# Patient Record
Sex: Male | Born: 1960 | Race: White | Hispanic: No | Marital: Married | Smoking: Former smoker
Health system: Southern US, Community
[De-identification: ages and names within clinical notes are randomized; demographics above are authoritative.]

## PROBLEM LIST (undated history)

## (undated) HISTORY — PX: PROSTATE BIOPSY: SHX241

## (undated) HISTORY — PX: WISDOM TOOTH EXTRACTION: SHX21

---

## 2009-04-21 ENCOUNTER — Ambulatory Visit: Payer: Self-pay | Admitting: Family Medicine

## 2010-12-13 ENCOUNTER — Ambulatory Visit (INDEPENDENT_AMBULATORY_CARE_PROVIDER_SITE_OTHER): Payer: 59 | Admitting: Internal Medicine

## 2010-12-13 ENCOUNTER — Encounter: Payer: Self-pay | Admitting: Internal Medicine

## 2010-12-13 VITALS — BP 120/80 | HR 81 | Temp 98.1°F | Ht 75.0 in | Wt 250.8 lb

## 2010-12-13 DIAGNOSIS — R059 Cough, unspecified: Secondary | ICD-10-CM

## 2010-12-13 DIAGNOSIS — R05 Cough: Secondary | ICD-10-CM

## 2010-12-13 MED ORDER — PREDNISONE (PAK) 10 MG PO TABS: ORAL_TABLET | ORAL | Status: AC

## 2010-12-13 NOTE — Patient Instructions (Addendum)
As long as you have any tendency at all to cough I recommend you Prilosec 20 mg  30 mg before bfast daily and Pepcid 20 mg one at night  GERD (REFLUX)  is an extremely common cause of respiratory symptoms, many times with no significant heartburn at all.    It can be treated with medication, but also with lifestyle changes including avoidance of late meals, excessive alcohol, smoking cessation, and avoid fatty foods, chocolate, peppermint, colas, red wine, and acidic juices such as orange juice.  NO MINT OR MENTHOL PRODUCTS SO NO COUGH DROPS  USE SUGARLESS CANDY INSTEAD (jolley ranchers or Stover's)  NO OIL BASED VITAMINS   Prednisone 10 mg take  4 each am x 2 days,   2 each am x 2 days,  1 each am x2days and stop   Please see patient coordinator before you leave today  to schedule sinus CT  We will call you with your cxr and labs  Please schedule a follow up office visit in 4 weeks, call sooner if needed

## 2010-12-13 NOTE — Assessment & Plan Note (Signed)
The most common causes of chronic cough in immunocompetent adults include the following: upper airway cough syndrome (UACS), previously referred to as postnasal drip syndrome (PNDS), which is caused by variety of rhinosinus conditions; (2) asthma; (3) GERD; (4) chronic bronchitis from cigarette smoking or other inhaled environmental irritants; (5) nonasthmatic eosinophilic bronchitis; and (6) bronchiectasis.   These conditions, singly or in combination, have accounted for up to 94% of the causes of chronic cough in prospective studies.   Other conditions have constituted no >6% of the causes in prospective studies These have included bronchogenic carcinoma, chronic interstitial pneumonia, sarcoidosis, left ventricular failure, ACEI-induced cough, and aspiration from a condition associated with pharyngeal dysfunction.   This is most c/w  Classic Upper airway cough syndrome, so named because it's frequently impossible to sort out how much is  CR/sinusitis with freq throat clearing (which can be related to primary GERD)   vs  causing  secondary (" extra esophageal")  GERD from wide swings in gastric pressure that occur with throat clearing, often  promoting self use of mint and menthol lozenges that reduce the lower esophageal sphincter tone and exacerbate the problem further in a cyclical fashion.   These are the same pts who not infrequently have failed to tolerate ace inhibitors,  dry powder inhalers or biphosphonates or report having reflux symptoms that don't respond to standard doses of PPI , and are easily confused as having aecopd or asthma flares,   See instructions for specific recommendations which were reviewed directly with the patient who was given a copy with highlighter outlining the key components.  

## 2010-12-13 NOTE — Progress Notes (Signed)
  Subjective:    Patient ID: Eugene Cummings, male    DOB: Feb 26, 1961, 50 y.o.   MRN: [id number]  HPI 57 yowm never regular with minimal allergy issues and new cough starting around 2010 self-referred for eval 12/13/2010   12/13/2010 Initial pulmonary office eval for persistent waxes and wanes for several years some better with abx,  Even better with prednisone bit even then not completely.  Some worse in am but doesn't disturb sleep,  Some discolored mucus daily.  Some doe with exertion like steps but no noct sob.    Pt denies any significant sore throat, dysphagia, itching, sneezing,  nasal congestion or excess/ purulent secretions,  fever, chills, sweats, unintended wt loss, pleuritic or exertional cp, hempoptysis, orthopnea pnd or leg swelling.    Also denies any obvious fluctuation of symptoms with weather or environmental changes or other aggravating or alleviating factors.     Review of Systems  Constitutional: Negative for fever, chills, activity change, appetite change and unexpected weight change.  HENT: Positive for dental problem. Negative for congestion, sore throat, rhinorrhea, sneezing, trouble swallowing, voice change and postnasal drip.   Eyes: Negative for visual disturbance.  Respiratory: Positive for cough and shortness of breath. Negative for choking.   Cardiovascular: Negative for chest pain and leg swelling.  Gastrointestinal: Negative for nausea, vomiting and abdominal pain.  Genitourinary: Negative for difficulty urinating.  Musculoskeletal: Negative for arthralgias.  Skin: Negative for rash.  Psychiatric/Behavioral: Negative for behavioral problems and confusion.       Objective:   Physical Exam Pleasant amb wm nad but freq throat clearing noted. Wt 250 12/13/2010  HEENT: nl dentition,  and orophanx. Nl external ear canals without cough reflex. Mod nonspecific turbinate edema   NECK :  without JVD/Nodes/TM/ nl carotid upstrokes bilaterally   LUNGS: no acc  muscle use, clear to A and P bilaterally without cough on insp or exp maneuvers   CV:  RRR  no s3 or murmur or increase in P2, no edema   ABD:  soft and nontender with nl excursion in the supine position. No bruits or organomegaly, bowel sounds nl  MS:  warm without deformities, calf tenderness, cyanosis or clubbing  SKIN: warm and dry without lesions    NEURO:  alert, approp, no deficits         Assessment & Plan:

## 2010-12-15 ENCOUNTER — Ambulatory Visit (INDEPENDENT_AMBULATORY_CARE_PROVIDER_SITE_OTHER)
Admission: RE | Admit: 2010-12-15 | Discharge: 2010-12-15 | Disposition: A | Discharge status: Home / Self Care | Payer: 59 | Source: Ambulatory Visit | Attending: Internal Medicine | Admitting: Internal Medicine

## 2010-12-15 DIAGNOSIS — R05 Cough: Secondary | ICD-10-CM

## 2010-12-15 DIAGNOSIS — R059 Cough, unspecified: Secondary | ICD-10-CM

## 2010-12-16 ENCOUNTER — Telehealth: Payer: Self-pay | Admitting: Internal Medicine

## 2010-12-16 NOTE — Telephone Encounter (Signed)
Pt aware of results 

## 2010-12-16 NOTE — Progress Notes (Signed)
Quick Note:   Pt aware of results and will discuss at next OV. ______

## 2017-11-09 ENCOUNTER — Encounter: Payer: Self-pay | Admitting: *Deleted

## 2017-11-20 ENCOUNTER — Encounter: Payer: Self-pay | Admitting: Family Medicine

## 2017-11-20 ENCOUNTER — Ambulatory Visit (INDEPENDENT_AMBULATORY_CARE_PROVIDER_SITE_OTHER): Payer: BC Managed Care – PPO | Admitting: Family Medicine

## 2017-11-20 VITALS — BP 138/80 | HR 90 | Temp 98.2°F | Resp 12 | Ht 75.0 in | Wt 247.0 lb

## 2017-11-20 DIAGNOSIS — Z7689 Persons encountering health services in other specified circumstances: Secondary | ICD-10-CM

## 2017-11-20 DIAGNOSIS — Z13220 Encounter for screening for lipoid disorders: Secondary | ICD-10-CM

## 2017-11-20 DIAGNOSIS — Z0000 Encounter for general adult medical examination without abnormal findings: Secondary | ICD-10-CM | POA: Diagnosis not present

## 2017-11-20 DIAGNOSIS — Z1211 Encounter for screening for malignant neoplasm of colon: Secondary | ICD-10-CM | POA: Diagnosis not present

## 2017-11-20 NOTE — Progress Notes (Signed)
Subjective:    Patient ID: Eugene Cummings, male    DOB: 1961-04-13, 57 y.o.   MRN: [id number]  HPI Patient is a very pleasant 57 year old Caucasian male here today to establish care.  Past medical history is relatively benign.  In the past he did have an elevated PSA.  Because of this he did undergo a prostate biopsy that was negative.  Outside of that, he has had a chronic cough for several years.  He states that he has had a thorough diagnostic workup including chest x-rays.  However he is never seen improvement.  The cough goes and comes.  He denies any fevers chills or shortness of breath or hemoptysis.  He denies any night sweats or fevers.  He cannot recall what has been tried to treat his chronic cough.  He is overdue for a colonoscopy.  He is due for prostate cancer screening however he has an appointment to see his urologist already scheduled for this year.  He is due for hepatitis C and HIV screening.  He is due for a tetanus shot. No past medical history on file. Past Surgical History:  Procedure Laterality Date  . PROSTATE BIOPSY     Current Outpatient Medications on File Prior to Visit  Medication Sig Dispense Refill  . tadalafil (CIALIS) 20 MG tablet Take 20 mg by mouth daily as needed for erectile dysfunction.     No current facility-administered medications on file prior to visit.    No Known Allergies Social History   Socioeconomic History  . Marital status: Married    Spouse name: Not on file  . Number of children: 2  . Years of education: Not on file  . Highest education level: Not on file  Occupational History  . 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 Financial resource strain: Not on file  . Food insecurity:    Worry: Not on file    Inability: Not on file  . Transportation needs:    Medical: Not on file    Non-medical: Not on file  Tobacco Use  . Smoking status: Former Smoker    Packs/day: 0.50   Years: 2.00    Pack years: 1.00    Types: Cigarettes    Last attempt to quit: 08/29/1978    Years since quitting: 39.2  . Smokeless tobacco: Never Used  Substance and Sexual Activity  . Alcohol use: Yes    Comment: twice per week  . Drug use: No    Comment: smoked occ maryjuana and states he stopped this in Nov 2011  . Sexual activity: Not on file  Lifestyle  . Physical activity:    Days per week: Not on file    Minutes per session: Not on file  . Stress: Not on file  Relationships  . Social connections:    Talks on phone: Not on file    Gets together: Not on file    Attends religious service: Not on file    Active member of club or organization: Not on file    Attends meetings of clubs or organizations: Not on file    Relationship status: Not on file  . Intimate partner violence:    Fear of current or ex partner: Not on file    Emotionally abused: Not on file    Physically abused: Not on file    Forced sexual activity: Not on file  Other Topics Concern  .  Not on file  Social History Narrative  . Not on file   Family History  Problem Relation Age of Onset  . COPD Maternal Grandfather   . Cancer Mother        breast, double mastectomy  . Deep vein thrombosis Father       Review of Systems  All other systems reviewed and are negative.      Objective:   Physical Exam  Constitutional: He is oriented to person, place, and time. He appears well-developed and well-nourished. No distress.  HENT:  Head: Normocephalic and atraumatic.  Right Ear: External ear normal.  Left Ear: External ear normal.  Nose: Nose normal.  Mouth/Throat: Oropharynx is clear and moist. No oropharyngeal exudate.  Eyes: Pupils are equal, round, and reactive to light. Conjunctivae and EOM are normal. Right eye exhibits no discharge. Left eye exhibits no discharge. No scleral icterus.  Neck: Normal range of motion. Neck supple. No JVD present. No tracheal deviation present. No thyromegaly  present.  Cardiovascular: Normal rate, regular rhythm, normal heart sounds and intact distal pulses. Exam reveals no gallop and no friction rub.  No murmur heard. Pulmonary/Chest: Effort normal and breath sounds normal. No stridor. No respiratory distress. He has no wheezes. He has no rales. He exhibits no tenderness.  Abdominal: Soft. Bowel sounds are normal. He exhibits no distension and no mass. There is no tenderness. There is no rebound and no guarding.  Musculoskeletal: Normal range of motion. He exhibits no edema, tenderness or deformity.  Lymphadenopathy:    He has no cervical adenopathy.  Neurological: He is alert and oriented to person, place, and time. He has normal reflexes. He displays normal reflexes. No cranial nerve deficit. He exhibits normal muscle tone. Coordination normal.  Skin: Skin is warm. No rash noted. He is not diaphoretic. No erythema. No pallor.  Psychiatric: He has a normal mood and affect. His behavior is normal. Judgment and thought content normal.  Vitals reviewed.         Assessment & Plan:  Encounter to establish care with new doctor  General medical exam  Colon cancer screening  Physical exam today is completely normal.  Blood pressure is borderline.  I have recommended a colonoscopy.  I will schedule the patient to meet with a gastroenterologist.  I would like him to return fasting for a CBC, CMP, and fasting lipid panel.  Patient politely declines HIV and hepatitis C screening due to lack of risk factors.  I recommended a PSA however he has an appointment to see his urologist later this year and defers to him for screening.  I recommended a tetanus shot but the patient politely declines.

## 2017-12-12 ENCOUNTER — Other Ambulatory Visit: Payer: BC Managed Care – PPO

## 2017-12-12 LAB — COMPLETE METABOLIC PANEL WITH GFR
AG RATIO: 1.4 (calc) (ref 1.0–2.5)
ALBUMIN MSPROF: 4.1 g/dL (ref 3.6–5.1)
ALKALINE PHOSPHATASE (APISO): 88 U/L (ref 40–115)
ALT: 23 U/L (ref 9–46)
AST: 20 U/L (ref 10–35)
BILIRUBIN TOTAL: 0.7 mg/dL (ref 0.2–1.2)
BUN: 19 mg/dL (ref 7–25)
CO2: 28 mmol/L (ref 20–32)
Calcium: 9 mg/dL (ref 8.6–10.3)
Chloride: 104 mmol/L (ref 98–110)
Creat: 1.08 mg/dL (ref 0.70–1.33)
GFR, Est African American: 88 mL/min/{1.73_m2} (ref >=60)
GFR, Est Non African American: 76 mL/min/{1.73_m2} (ref >=60)
Globulin: 2.9 g/dL (calc) (ref 1.9–3.7)
Glucose, Bld: 87 mg/dL (ref 65–99)
POTASSIUM: 4.3 mmol/L (ref 3.5–5.3)
SODIUM: 138 mmol/L (ref 135–146)
Total Protein: 7 g/dL (ref 6.1–8.1)

## 2017-12-12 LAB — CBC WITH DIFFERENTIAL/PLATELET
BASOS ABS: 21 {cells}/uL (ref 0–200)
Basophils Relative: 0.3 %
EOS ABS: 133 {cells}/uL (ref 15–500)
EOS PCT: 1.9 %
HEMATOCRIT: 46.6 % (ref 38.5–50.0)
Hemoglobin: 16.3 g/dL (ref 13.2–17.1)
LYMPHS ABS: 2541 {cells}/uL (ref 850–3900)
MCH: 31.2 pg (ref 27.0–33.0)
MCHC: 35 g/dL (ref 32.0–36.0)
MCV: 89.3 fL (ref 80.0–100.0)
MPV: 9.5 fL (ref 7.5–12.5)
Monocytes Relative: 11.1 %
NEUTROS PCT: 50.4 %
Neutro Abs: 3528 cells/uL (ref 1500–7800)
Platelets: 142 10*3/uL (ref 140–400)
RBC: 5.22 10*6/uL (ref 4.20–5.80)
RDW: 13.7 % (ref 11.0–15.0)
Total Lymphocyte: 36.3 %
WBC: 7 10*3/uL (ref 3.8–10.8)
WBCMIX: 777 {cells}/uL (ref 200–950)

## 2017-12-12 LAB — LIPID PANEL
CHOLESTEROL: 182 mg/dL (ref <200)
HDL: 50 mg/dL (ref >40)
LDL Cholesterol (Calc): 113 mg/dL (calc) — ABNORMAL HIGH
Non-HDL Cholesterol (Calc): 132 mg/dL (calc) — ABNORMAL HIGH (ref <130)
Total CHOL/HDL Ratio: 3.6 (calc) (ref <5.0)
Triglycerides: 88 mg/dL (ref <150)

## 2017-12-18 ENCOUNTER — Encounter: Payer: Self-pay | Admitting: Family Medicine

## 2017-12-22 ENCOUNTER — Encounter: Payer: Self-pay | Admitting: Family Medicine

## 2018-02-22 ENCOUNTER — Other Ambulatory Visit: Payer: Self-pay

## 2018-02-22 ENCOUNTER — Ambulatory Visit (AMBULATORY_SURGERY_CENTER): Payer: Self-pay | Admitting: *Deleted

## 2018-02-22 VITALS — Ht 76.0 in | Wt 258.4 lb

## 2018-02-22 DIAGNOSIS — Z800 Family history of malignant neoplasm of digestive organs: Secondary | ICD-10-CM

## 2018-02-22 MED ORDER — SUPREP BOWEL PREP KIT 17.5-3.13-1.6 GM/177ML PO SOLN: 1.0000 | Freq: Once | ORAL | 0 refills | Status: AC

## 2018-02-22 NOTE — Progress Notes (Signed)
No egg or soy allergy known to patient  No issues with past sedation with any surgeries  or procedures, no intubation problems  No diet pills per patient No home 02 use per patient  No blood thinners per patient  Pt denies issues with constipation  No A fib or A flutter  EMMI video offered, patient declined. Coupon(15.00) for suprep given.

## 2018-02-23 ENCOUNTER — Encounter: Payer: Self-pay | Admitting: Internal Medicine

## 2018-03-09 ENCOUNTER — Ambulatory Visit (AMBULATORY_SURGERY_CENTER): Payer: BC Managed Care – PPO | Admitting: Internal Medicine

## 2018-03-09 ENCOUNTER — Encounter: Payer: Self-pay | Admitting: Internal Medicine

## 2018-03-09 VITALS — BP 108/78 | HR 60 | Temp 98.7°F | Resp 12 | Ht 76.0 in | Wt 258.0 lb

## 2018-03-09 DIAGNOSIS — D124 Benign neoplasm of descending colon: Secondary | ICD-10-CM | POA: Diagnosis not present

## 2018-03-09 DIAGNOSIS — D120 Benign neoplasm of cecum: Secondary | ICD-10-CM | POA: Diagnosis not present

## 2018-03-09 DIAGNOSIS — Z800 Family history of malignant neoplasm of digestive organs: Secondary | ICD-10-CM

## 2018-03-09 DIAGNOSIS — K635 Polyp of colon: Secondary | ICD-10-CM

## 2018-03-09 DIAGNOSIS — Z1211 Encounter for screening for malignant neoplasm of colon: Secondary | ICD-10-CM

## 2018-03-09 DIAGNOSIS — D122 Benign neoplasm of ascending colon: Secondary | ICD-10-CM

## 2018-03-09 DIAGNOSIS — D125 Benign neoplasm of sigmoid colon: Secondary | ICD-10-CM

## 2018-03-09 MED ORDER — SODIUM CHLORIDE 0.9 % IV SOLN: 500.0000 mL | Freq: Once | INTRAVENOUS | Status: AC

## 2018-03-09 NOTE — Progress Notes (Signed)
Alert and oriented x3, pleased with MAC, report to RN  

## 2018-03-09 NOTE — Progress Notes (Signed)
JMP having computer issues.  This is why pt here so long after VSs. Karlee Staff/Recovery

## 2018-03-09 NOTE — Op Note (Signed)
[name] Procedure Date: 03/09/2018 1:25 PM MRN: [id number] Endoscopist: Jerene Bears , MD Age: 57 Referring MD:  Date of Birth: 11/26/1960 Gender: Male Account #: [id number] Procedure:                Colonoscopy Indications:              Screening for colorectal malignant neoplasm, This                            is the patient's first colonoscopy Medicines:                Monitored Anesthesia Care Procedure:                Pre-Anesthesia Assessment:                           - Prior to the procedure, a History and Physical                            was performed, and patient medications and                            allergies were reviewed. The patient's tolerance of                            previous anesthesia was also reviewed. The risks                            and benefits of the procedure and the sedation                            options and risks were discussed with the patient.                            All questions were answered, and informed consent                            was obtained. Prior Anticoagulants: The patient has                            taken no previous anticoagulant or antiplatelet                            agents. ASA Grade Assessment: II - A patient with                            mild systemic disease. After reviewing the risks                            and benefits, the patient was deemed in                            satisfactory condition to undergo the procedure.  After obtaining informed consent, the colonoscope                            was passed under direct vision. Throughout the                            procedure, the patient's blood pressure, pulse, and                            oxygen saturations were monitored continuously. The                            Colonoscope was introduced through the anus and                            advanced to the cecum, identified  by appendiceal                            orifice and ileocecal valve. The colonoscopy was                            performed without difficulty. The patient tolerated                            the procedure well. The quality of the bowel                            preparation was good. The ileocecal valve,                            appendiceal orifice, and rectum were photographed. Scope In: 1:43:25 PM Scope Out: 2:01:51 PM Scope Withdrawal Time: 0 hours 15 minutes 29 seconds  Total Procedure Duration: 0 hours 18 minutes 26 seconds  Findings:                 The digital rectal exam was normal.                           A 5 mm polyp was found in the cecum. The polyp was                            sessile. The polyp was removed with a cold snare.                            Resection and retrieval were complete.                           A 6 mm polyp was found in the ascending colon. The                            polyp was sessile. The polyp was removed with a  cold snare. Resection and retrieval were complete.                           Two sessile polyps were found in the descending                            colon. The polyps were 4 to 6 mm in size. These                            polyps were removed with a cold snare. Resection                            and retrieval were complete.                           A 5 mm polyp was found in the sigmoid colon. The                            polyp was sessile. The polyp was removed with a                            cold snare. Resection and retrieval were complete.                           Multiple small-mouthed diverticula were found in                            the sigmoid colon.                           Internal hemorrhoids were found during                            retroflexion. The hemorrhoids were small. Complications:            No immediate complications. Estimated Blood Loss:     Estimated blood loss  was minimal. Impression:               - One 5 mm polyp in the cecum, removed with a cold                            snare. Resected and retrieved.                           - One 6 mm polyp in the ascending colon, removed                            with a cold snare. Resected and retrieved.                           - Two 4 to 6 mm polyps in the descending colon,                            removed with a  cold snare. Resected and retrieved.                           - One 5 mm polyp in the sigmoid colon, removed with                            a cold snare. Resected and retrieved.                           - Diverticulosis in the sigmoid colon.                           - Small internal hemorrhoids. Recommendation:           - Patient has a contact number available for                            emergencies. The signs and symptoms of potential                            delayed complications were discussed with the                            patient. Return to normal activities tomorrow.                            Written discharge instructions were provided to the                            patient.                           - Resume previous diet.                           - Continue present medications.                           - Await pathology results.                           - Repeat colonoscopy is recommended. The                            colonoscopy date will be determined after pathology                            results from today's exam become available for                            review. Jerene Bears, MD 03/09/2018 2:18:25 PM This report has been signed electronically.

## 2018-03-09 NOTE — Progress Notes (Signed)
Called to room to assist during endoscopic procedure.  Patient ID and intended procedure confirmed with present staff. Received instructions for my participation in the procedure from the performing physician.  

## 2018-03-12 ENCOUNTER — Telehealth: Payer: Self-pay | Admitting: *Deleted

## 2018-03-12 NOTE — Telephone Encounter (Signed)
  Follow up Call-  Call back number 03/09/2018  Post procedure Call Back phone  # [phone]  Permission to leave phone message Yes  Some recent data might be hidden     No answer at # given.  LM on VM.

## 2018-03-12 NOTE — Telephone Encounter (Signed)
  Follow up Call-  Call back number 03/09/2018  Post procedure Call Back phone  # [phone]  Permission to leave phone message Yes  Some recent data might be hidden     Patient questions:  Do you have a fever, pain , or abdominal swelling? No. Pain Score  0 *  Have you tolerated food without any problems? Yes.    Have you been able to return to your normal activities? Yes.    Do you have any questions about your discharge instructions: Diet   No. Medications  No. Follow up visit  No.  Do you have questions or concerns about your Care? No.  Actions: * If pain score is 4 or above: No action needed, pain <4.

## 2018-03-16 ENCOUNTER — Encounter: Payer: Self-pay | Admitting: Internal Medicine

## 2020-08-24 ENCOUNTER — Emergency Department (HOSPITAL_COMMUNITY)
Admission: EM | Admit: 2020-08-24 | Discharge: 2020-08-25 | Disposition: A | Discharge status: Home / Self Care | Payer: BC Managed Care – PPO | Attending: Emergency Medicine | Admitting: Emergency Medicine

## 2020-08-24 ENCOUNTER — Emergency Department (HOSPITAL_COMMUNITY): Payer: BC Managed Care – PPO

## 2020-08-24 ENCOUNTER — Other Ambulatory Visit: Payer: Self-pay

## 2020-08-24 DIAGNOSIS — S93125A Dislocation of metatarsophalangeal joint of left lesser toe(s), initial encounter: Secondary | ICD-10-CM | POA: Insufficient documentation

## 2020-08-24 DIAGNOSIS — Z23 Encounter for immunization: Secondary | ICD-10-CM | POA: Diagnosis not present

## 2020-08-24 DIAGNOSIS — Z87891 Personal history of nicotine dependence: Secondary | ICD-10-CM | POA: Diagnosis not present

## 2020-08-24 DIAGNOSIS — S93105A Unspecified dislocation of left toe(s), initial encounter: Secondary | ICD-10-CM

## 2020-08-24 DIAGNOSIS — S01112A Laceration without foreign body of left eyelid and periocular area, initial encounter: Secondary | ICD-10-CM | POA: Insufficient documentation

## 2020-08-24 DIAGNOSIS — S99922A Unspecified injury of left foot, initial encounter: Secondary | ICD-10-CM | POA: Diagnosis present

## 2020-08-24 DIAGNOSIS — Y9285 Railroad track as the place of occurrence of the external cause: Secondary | ICD-10-CM | POA: Diagnosis not present

## 2020-08-24 MED ORDER — IBUPROFEN 800 MG PO TABS: 800.0000 mg | ORAL_TABLET | Freq: Once | ORAL | Status: DC

## 2020-08-24 MED ORDER — OXYCODONE-ACETAMINOPHEN 5-325 MG PO TABS
2.0000 | ORAL_TABLET | Freq: Once | ORAL | Status: AC
  Administered 2020-08-24: 20:00:00 2 via ORAL
  Filled 2020-08-24: qty 2
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
## 2020-08-25 ENCOUNTER — Emergency Department (HOSPITAL_COMMUNITY): Payer: BC Managed Care – PPO

## 2020-08-25 MED ORDER — HYDROCODONE-ACETAMINOPHEN 5-325 MG PO TABS: 1.0000 | ORAL_TABLET | ORAL | 0 refills | Status: AC | PRN

## 2020-08-25 MED ORDER — LIDOCAINE HCL (PF) 1 % IJ SOLN
30.0000 mL | Freq: Once | INTRAMUSCULAR | Status: AC
  Administered 2020-08-25: 02:00:00 30 mL via INTRADERMAL
  Filled 2020-08-25: qty 30

## 2020-08-25 MED ORDER — TETANUS-DIPHTH-ACELL PERTUSSIS 5-2.5-18.5 LF-MCG/0.5 IM SUSY
0.5000 mL | PREFILLED_SYRINGE | Freq: Once | INTRAMUSCULAR | Status: AC
  Administered 2020-08-25: 02:00:00 0.5 mL via INTRAMUSCULAR
  Filled 2020-08-25: qty 0.5

## 2020-08-25 NOTE — Progress Notes (Signed)
Orthopedic Tech Progress Note Patient Details:  Eugene Cummings 1961/06/02 [id number]  Ortho Devices Type of Ortho Device: Postop shoe/boot,Crutches Ortho Device/Splint Location: lle Ortho Device/Splint Interventions: Ordered,Adjustment,Application   Post Interventions Patient Tolerated: Well Instructions Provided: Care of device,Adjustment of device   Trinna Post 08/25/2020, 3:03 AM

## 2020-08-25 NOTE — Discharge Instructions (Addendum)
Take the prescribed medication as directed.  Can take motrin with this if needed (up to 800mg  3x daily). Follow-up with Dr. (orthopedics) about your foot-- call his office in the morning to schedule appt. Follow-up with your primary care doctor for suture removal in 7-10 days. Return to the ED for new or worsening symptoms.

## 2020-08-25 NOTE — ED Notes (Signed)
ED Provider at bedside. 
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
## 2020-08-25 NOTE — ED Notes (Signed)
Lt hand wound cleansed with NS and nonadherent dressing applied and secured with Kerlix.

## 2020-08-25 NOTE — ED Notes (Signed)
Ortho at bedside.

## 2020-08-25 NOTE — ED Notes (Signed)
Ortho tech made aware of need for post op shoe and crutches.

## 2021-05-03 ENCOUNTER — Encounter: Payer: Self-pay | Admitting: Internal Medicine
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
## 2022-08-25 ENCOUNTER — Encounter: Payer: Self-pay | Admitting: Internal Medicine

## 2022-09-30 ENCOUNTER — Ambulatory Visit (AMBULATORY_SURGERY_CENTER): Payer: BC Managed Care – PPO

## 2022-09-30 DIAGNOSIS — Z86010 Personal history of colonic polyps: Secondary | ICD-10-CM

## 2022-09-30 DIAGNOSIS — Z800 Family history of malignant neoplasm of digestive organs: Secondary | ICD-10-CM

## 2022-09-30 MED ORDER — NA SULFATE-K SULFATE-MG SULF 17.5-3.13-1.6 GM/177ML PO SOLN: 1.0000 | Freq: Once | ORAL | 0 refills | Status: AC

## 2022-09-30 NOTE — Progress Notes (Signed)

## 2022-10-12 ENCOUNTER — Encounter: Payer: Self-pay | Admitting: Internal Medicine

## 2022-10-19 ENCOUNTER — Telehealth: Payer: Self-pay | Admitting: Internal Medicine

## 2022-10-19 ENCOUNTER — Other Ambulatory Visit: Payer: Self-pay

## 2022-10-19 DIAGNOSIS — Z800 Family history of malignant neoplasm of digestive organs: Secondary | ICD-10-CM

## 2022-10-19 DIAGNOSIS — Z86010 Personal history of colonic polyps: Secondary | ICD-10-CM

## 2022-10-19 MED ORDER — NA SULFATE-K SULFATE-MG SULF 17.5-3.13-1.6 GM/177ML PO SOLN: 1.0000 | Freq: Once | ORAL | 0 refills | Status: AC

## 2022-10-19 NOTE — Telephone Encounter (Signed)
Called patient back.  Resent Suprep to [facility].

## 2022-10-19 NOTE — Telephone Encounter (Signed)
Inbound call from patient requesting his prep medication to be resent to the pharmacy. Patient is scheduled to have colonoscopy on 2/23 at 1:30. Please advise.

## 2022-10-21 ENCOUNTER — Encounter: Payer: Self-pay | Admitting: Internal Medicine

## 2022-10-21 ENCOUNTER — Ambulatory Visit (AMBULATORY_SURGERY_CENTER): Payer: BC Managed Care – PPO | Admitting: Internal Medicine

## 2022-10-21 VITALS — BP 156/78 | HR 57 | Temp 98.2°F | Resp 12 | Ht 76.0 in | Wt 234.0 lb

## 2022-10-21 DIAGNOSIS — D125 Benign neoplasm of sigmoid colon: Secondary | ICD-10-CM | POA: Diagnosis not present

## 2022-10-21 DIAGNOSIS — Z86010 Personal history of colonic polyps: Secondary | ICD-10-CM

## 2022-10-21 DIAGNOSIS — Z09 Encounter for follow-up examination after completed treatment for conditions other than malignant neoplasm: Secondary | ICD-10-CM

## 2022-10-21 DIAGNOSIS — K635 Polyp of colon: Secondary | ICD-10-CM

## 2022-10-21 MED ORDER — SODIUM CHLORIDE 0.9 % IV SOLN: 500.0000 mL | Freq: Once | INTRAVENOUS | Status: DC

## 2022-10-21 NOTE — Progress Notes (Signed)
Called to room to assist during endoscopic procedure.  Patient ID and intended procedure confirmed with present staff. Received instructions for my participation in the procedure from the performing physician.  

## 2022-10-21 NOTE — Op Note (Signed)
[place] Patient Name: Eugene Cummings Procedure Date: 10/21/2022 12:41 PM MRN: [id number] Endoscopist: Jerene Bears , MD, [id number] Age: 62 Referring MD:  Date of Birth: [birth year] Gender: Male Account #: [id number] Procedure:                Colonoscopy Indications:              High risk colon cancer surveillance: Personal                            history of multiple adenomas + SSP, Last                            colonoscopy: July 2019 (3 polyps removed); FH colon                            cancer in mother Medicines:                Monitored Anesthesia Care Procedure:                Pre-Anesthesia Assessment:                           - Prior to the procedure, a History and Physical                            was performed, and patient medications and                            allergies were reviewed. The patient's tolerance of                            previous anesthesia was also reviewed. The risks                            and benefits of the procedure and the sedation                            options and risks were discussed with the patient.                            All questions were answered, and informed consent                            was obtained. Prior Anticoagulants: The patient has                            taken no anticoagulant or antiplatelet agents. ASA                            Grade Assessment: II - A patient with mild systemic                            disease. After reviewing the risks and benefits,  the patient was deemed in satisfactory condition to                            undergo the procedure.                           After obtaining informed consent, the colonoscope                            was passed under direct vision. Throughout the                            procedure, the patient's blood pressure, pulse, and                            oxygen saturations were monitored continuously. The                             CF [id number] was introduced through the anus                            and advanced to the cecum, identified by                            appendiceal orifice and ileocecal valve. The                            colonoscopy was performed without difficulty. The                            patient tolerated the procedure well. The quality                            of the bowel preparation was excellent. The                            ileocecal valve, appendiceal orifice, and rectum                            were photographed. Scope In: 1:07:43 PM Scope Out: 1:19:22 PM Scope Withdrawal Time: 0 hours 8 minutes 58 seconds  Total Procedure Duration: 0 hours 11 minutes 39 seconds  Findings:                 The digital rectal exam was normal.                           A 4 mm polyp was found in the sigmoid colon. The                            polyp was sessile. The polyp was removed with a                            cold snare. Resection and retrieval were complete.  Internal hemorrhoids were found during                            retroflexion. The hemorrhoids were small. Complications:            No immediate complications. Estimated Blood Loss:     Estimated blood loss was minimal. Impression:               - One 4 mm polyp in the sigmoid colon, removed with                            a cold snare. Resected and retrieved.                           - Small internal hemorrhoids. Recommendation:           - Patient has a contact number available for                            emergencies. The signs and symptoms of potential                            delayed complications were discussed with the                            patient. Return to normal activities tomorrow.                            Written discharge instructions were provided to the                            patient.                           - Resume previous diet.                            - Continue present medications.                           - Await pathology results.                           - Repeat colonoscopy in 5 years for surveillance. Jerene Bears, MD 10/21/2022 1:22:56 PM This report has been signed electronically.

## 2022-10-21 NOTE — Progress Notes (Signed)
Pt's states no medical or surgical changes since previsit or office visit. 

## 2022-10-21 NOTE — Progress Notes (Signed)
GASTROENTEROLOGY PROCEDURE H&P NOTE   Primary Care Physician: Susy Frizzle, MD    Reason for Procedure:   Hx of colonic polyps  Plan:    colonoscopy  Patient is appropriate for endoscopic procedure(s) in the ambulatory ([facility]) setting.  The nature of the procedure, as well as the risks, benefits, and alternatives were carefully and thoroughly reviewed with the patient. Ample time for discussion and questions allowed. The patient understood, was satisfied, and agreed to proceed.     HPI: Eugene Cummings is a 62 y.o. male who presents for surveillance colonoscopy.  Medical history as below.  Tolerated the prep.  No recent chest pain or shortness of breath.  No abdominal pain today.  History reviewed. No pertinent past medical history.  Past Surgical History:  Procedure Laterality Date   PROSTATE BIOPSY     WISDOM TOOTH EXTRACTION      Prior to Admission medications   Medication Sig Start Date End Date Taking? Authorizing Provider  HYDROcodone-acetaminophen (NORCO/VICODIN) 5-325 MG tablet Take 1 tablet by mouth every 4 (four) hours as needed. Patient not taking: Reported on 09/30/2022 08/25/20   Larene Pickett, PA-C  tadalafil (CIALIS) 20 MG tablet Take 20 mg by mouth daily as needed for erectile dysfunction. Patient not taking: Reported on 09/30/2022    [provider]    Current Outpatient Medications  Medication Sig Dispense Refill   HYDROcodone-acetaminophen (NORCO/VICODIN) 5-325 MG tablet Take 1 tablet by mouth every 4 (four) hours as needed. (Patient not taking: Reported on 09/30/2022) 15 tablet 0   tadalafil (CIALIS) 20 MG tablet Take 20 mg by mouth daily as needed for erectile dysfunction. (Patient not taking: Reported on 09/30/2022)     Current Facility-Administered Medications  Medication Dose Route Frequency Provider Last Rate Last Admin   0.9 %  sodium chloride infusion  500 mL Intravenous Once Azure Barrales, Lajuan Lines, MD       0.9 %  sodium chloride infusion  500 mL  Intravenous Once Ryker Pherigo, Lajuan Lines, MD        Allergies as of 10/21/2022   (No Known Allergies)    Family History  Problem Relation Age of Onset   Cancer Mother        breast, double mastectomy   Colon cancer Mother    Breast cancer Mother    Deep vein thrombosis Father    Colon polyps Brother    COPD Maternal Grandfather    Esophageal cancer Neg Hx    Pancreatic cancer Neg Hx    Rectal cancer Neg Hx    Stomach cancer Neg Hx     Social History   Socioeconomic History   Marital status: Married    Spouse name: Not on file   Number of children: 2   Years of education: Not on file   Highest education level: Not on file  Occupational History   Occupation: Producer, 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  Tobacco Use   Smoking status: Former    Packs/day: 0.50    Years: 2.00    Total pack years: 1.00    Types: Cigarettes    Quit date: 08/29/1978    Years since quitting: 44.1   Smokeless tobacco: Never  Vaping Use   Vaping Use: Never used  Substance and Sexual Activity   Alcohol use: Yes    Comment: twice per week   Drug use: No    Comment: smoked occ maryjuana and states he stopped this  in Nov 2011   Sexual activity: Not on file  Other Topics Concern   Not on file  Social History Narrative   Not on file   Social Determinants of Health   Financial Resource Strain: Not on file  Food Insecurity: Not on file  Transportation Needs: Not on file  Physical Activity: Not on file  Stress: Not on file  Social Connections: Not on file  Intimate Partner Violence: Not on file    Physical Exam: Vital signs in last 24 hours: '@BP'$  133/88   Pulse 65   Temp 98.2 F (36.8 C)   Ht '6\' 4"'$  (1.93 m)   Wt 234 lb (106.1 kg)   SpO2 97%   BMI 28.48 kg/m  GEN: NAD EYE: Sclerae anicteric ENT: MMM CV: Non-tachycardic Pulm: CTA b/l GI: Soft, NT/ND NEURO:  Alert & Oriented x 3   Zenovia Jarred, MD [facility]  10/21/2022 1:01 PM

## 2022-10-21 NOTE — Progress Notes (Signed)
Sedate, gd SR, tolerated procedure well, VSS, report to RN 

## 2022-10-21 NOTE — Patient Instructions (Signed)
Thank you for letting us take care of your healthcare needs today. PLease see handouts given to you on Polyps and  Hemorrhoids.    YOU HAD AN ENDOSCOPIC PROCEDURE TODAY AT [facility]:   Refer to the procedure report that was given to you for any specific questions about what was found during the examination.  If the procedure report does not answer your questions, please call your gastroenterologist to clarify.  If you requested that your care partner not be given the details of your procedure findings, then the procedure report has been included in a sealed envelope for you to review at your convenience later.  YOU SHOULD EXPECT: Some feelings of bloating in the abdomen. Passage of more gas than usual.  Walking can help get rid of the air that was put into your GI tract during the procedure and reduce the bloating. If you had a lower endoscopy (such as a colonoscopy or flexible sigmoidoscopy) you may notice spotting of blood in your stool or on the toilet paper. If you underwent a bowel prep for your procedure, you may not have a normal bowel movement for a few days.  Please Note:  You might notice some irritation and congestion in your nose or some drainage.  This is from the oxygen used during your procedure.  There is no need for concern and it should clear up in a day or so.  SYMPTOMS TO REPORT IMMEDIATELY:  Following lower endoscopy (colonoscopy or flexible sigmoidoscopy):  Excessive amounts of blood in the stool  Significant tenderness or worsening of abdominal pains  Swelling of the abdomen that is new, acute  Fever of 100F or higher  For urgent or emergent issues, a gastroenterologist can be reached at any hour by calling [phone]. Do not use MyChart messaging for urgent concerns.    DIET:  We do recommend a small meal at first, but then you may proceed to your regular diet.  Drink plenty of fluids but you should avoid alcoholic beverages for 24  hours.  ACTIVITY:  You should plan to take it easy for the rest of today and you should NOT DRIVE or use heavy machinery until tomorrow (because of the sedation medicines used during the test).    FOLLOW UP: Our staff will call the number listed on your records the next business day following your procedure.  We will call around 7:15- 8:00 am to check on you and address any questions or concerns that you may have regarding the information given to you following your procedure. If we do not reach you, we will leave a message.     If any biopsies were taken you will be contacted by phone or by letter within the next 1-3 weeks.  Please call us at [phone] if you have not heard about the biopsies in 3 weeks.    SIGNATURES/CONFIDENTIALITY: You and/or your care partner have signed paperwork which will be entered into your electronic medical record.  These signatures attest to the fact that that the information above on your After Visit Summary has been reviewed and is understood.  Full responsibility of the confidentiality of this discharge information lies with you and/or your care-partner.

## 2022-10-24 ENCOUNTER — Telehealth: Payer: Self-pay

## 2022-10-24 NOTE — Telephone Encounter (Signed)
Attempted f/u call. No answer, left VM. 

## 2022-10-27 ENCOUNTER — Encounter: Payer: Self-pay | Admitting: Internal Medicine
